# Patient Record
Sex: Female | Born: 2005 | Hispanic: Refuse to answer | Marital: Single | Smoking: Never smoker
Health system: Southern US, Community
[De-identification: ages and names within clinical notes are randomized; demographics above are authoritative.]

---
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
## 2025-01-06 ENCOUNTER — Ambulatory Visit
Admission: RE | Admit: 2025-01-06 | Discharge: 2025-01-06 | Disposition: A | Discharge status: Home / Self Care | Source: Ambulatory Visit | Attending: Nurse Practitioner | Admitting: Nurse Practitioner

## 2025-01-06 DIAGNOSIS — N644 Mastodynia: Secondary | ICD-10-CM

## 2025-02-07 ENCOUNTER — Ambulatory Visit: Admitting: Psychology

## 2025-04-07 ENCOUNTER — Encounter: Admitting: Nurse Practitioner
